# Patient Record
Sex: Male | Born: 1975 | Race: White | Hispanic: Yes | Marital: Married | Smoking: Never smoker
Health system: Southern US, Community
[De-identification: ages and names within clinical notes are randomized; demographics above are authoritative.]

## PROBLEM LIST (undated history)

## (undated) HISTORY — PX: LUMBAR FUSION: SHX111

## (undated) HISTORY — PX: TONSILLECTOMY: SUR1361

## (undated) HISTORY — PX: BACK SURGERY: SHX140

---

## 2003-12-17 ENCOUNTER — Inpatient Hospital Stay (HOSPITAL_COMMUNITY): Admission: RE | Admit: 2003-12-17 | Discharge: 2003-12-19 | Payer: Self-pay | Admitting: Neurosurgery

## 2004-02-03 ENCOUNTER — Encounter: Admission: RE | Admit: 2004-02-03 | Discharge: 2004-02-03 | Payer: Self-pay | Admitting: Neurosurgery

## 2004-03-30 ENCOUNTER — Encounter: Admission: RE | Admit: 2004-03-30 | Discharge: 2004-03-30 | Payer: Self-pay | Admitting: Neurosurgery

## 2005-08-03 ENCOUNTER — Ambulatory Visit (HOSPITAL_COMMUNITY): Admission: RE | Admit: 2005-08-03 | Discharge: 2005-08-03 | Payer: Self-pay | Admitting: Neurosurgery

## 2008-10-20 ENCOUNTER — Emergency Department: Payer: Self-pay | Admitting: Emergency Medicine

## 2012-09-17 DIAGNOSIS — E669 Obesity, unspecified: Secondary | ICD-10-CM | POA: Insufficient documentation

## 2017-04-05 ENCOUNTER — Ambulatory Visit
Admission: EM | Admit: 2017-04-05 | Discharge: 2017-04-05 | Disposition: A | Discharge status: Home / Self Care | Payer: Self-pay | Attending: Family Medicine | Admitting: Family Medicine

## 2017-04-05 ENCOUNTER — Ambulatory Visit (INDEPENDENT_AMBULATORY_CARE_PROVIDER_SITE_OTHER): Payer: Self-pay

## 2017-04-05 DIAGNOSIS — W19XXXA Unspecified fall, initial encounter: Secondary | ICD-10-CM

## 2017-04-05 DIAGNOSIS — M62830 Muscle spasm of back: Secondary | ICD-10-CM

## 2017-04-05 DIAGNOSIS — S42402A Unspecified fracture of lower end of left humerus, initial encounter for closed fracture: Secondary | ICD-10-CM

## 2017-04-05 MED ORDER — KETOROLAC TROMETHAMINE 60 MG/2ML IM SOLN
60.0000 mg | Freq: Once | INTRAMUSCULAR | Status: AC
  Administered 2017-04-05: 60 mg via INTRAMUSCULAR

## 2017-04-05 MED ORDER — ORPHENADRINE CITRATE ER 100 MG PO TB12: 100.0000 mg | ORAL_TABLET | Freq: Two times a day (BID) | ORAL | 0 refills | Status: DC

## 2017-04-05 MED ORDER — HYDROCODONE-ACETAMINOPHEN 5-325 MG PO TABS: 1.0000 | ORAL_TABLET | Freq: Three times a day (TID) | ORAL | 0 refills | Status: DC | PRN

## 2017-04-05 MED ORDER — MELOXICAM 15 MG PO TABS: 15.0000 mg | ORAL_TABLET | Freq: Every day | ORAL | 0 refills | Status: DC

## 2017-04-05 NOTE — ED Triage Notes (Signed)
Patient complains of a fall down the steps around 12pm. Patient states that he was coming down the steps and fell backwards and hit his lower back (bruising and redness in area) and his left elbow. Patient states that pain has been worsening throughout the day.

## 2017-04-05 NOTE — ED Notes (Signed)
Applied posterior splint to left arm. Tolerated well. Patient instructed on follow up and care of splint.

## 2017-04-05 NOTE — ED Provider Notes (Signed)
[facility]    CSN: [id number] Arrival date & time: 04/05/17  1634     History   Chief Complaint Chief Complaint  Patient presents with  . Fall    HPI Nicolas Pearson is a 41 y.o. male.   Patient is a 41 year old Hispanic male who states he slipped on some paper that was obliterating of some steps due to construction. States he fell back with his left arm out hurting his left elbow but also landing on his left hip and pelvic area. He's had a history of back surgery before but does not lumbar spines perfused but he is no swelling and increased pain in the left sacroiliac area as well. We revisited the pains about 6-7/10 standing still the second 9-10 over 10. Pain is progressively gotten worse during the day. His has some pain in his left elbow as well and some discomfort moving left elbow. He denies hitting his head or any loss of consciousness. He did not seem drug allergies he does not smoke. Size the lumbar fusion that he had years ago he's also had tonsillectomy. No pertinent family medical history relevant to today's visit.   The history is provided by the patient. No language interpreter was used.  Fall  This is a new problem. The current episode started 6 to 12 hours ago. The problem occurs constantly. The problem has not changed since onset.Pertinent negatives include no chest pain, no abdominal pain, no headaches and no shortness of breath. The symptoms are aggravated by standing. Nothing relieves the symptoms. He has tried nothing for the symptoms. The treatment provided no relief.    History reviewed. No pertinent past medical history.  There are no active problems to display for this patient.   Past Surgical History:  Procedure Laterality Date  . LUMBAR FUSION    . TONSILLECTOMY         Home Medications    Prior to Admission medications   Medication Sig Start Date End Date Taking? Authorizing Provider  HYDROcodone-acetaminophen (NORCO/VICODIN) 5-325 MG  tablet Take 1 tablet by mouth 3 (three) times daily as needed for moderate pain or severe pain. 04/05/17   Hassan RowanWade, Heraclio Seidman, MD  meloxicam (MOBIC) 15 MG tablet Take 1 tablet (15 mg total) by mouth daily. 04/05/17   Hassan RowanWade, Jasten Guyette, MD  orphenadrine (NORFLEX) 100 MG tablet Take 1 tablet (100 mg total) by mouth 2 (two) times daily. 04/05/17   Hassan RowanWade, Antonyo Hinderer, MD    Family History History reviewed. No pertinent family history.  Social History Social History  Substance Use Topics  . Smoking status: Never Smoker  . Smokeless tobacco: Never Used  . Alcohol use Yes     Comment: occasionally     Allergies   Patient has no known allergies.   Review of Systems Review of Systems  Respiratory: Negative for shortness of breath.   Cardiovascular: Negative for chest pain.  Gastrointestinal: Negative for abdominal pain.  Musculoskeletal: Positive for arthralgias, back pain and joint swelling.  Skin: Positive for color change.  Neurological: Negative for headaches.     Physical Exam Triage Vital Signs ED Triage Vitals  Enc Vitals Group     BP 04/05/17 1804 (!) 134/96     Pulse Rate 04/05/17 1804 70     Resp 04/05/17 1804 16     Temp 04/05/17 1804 98.2 F (36.8 C)     Temp Source 04/05/17 1804 Oral     SpO2 04/05/17 1804 100 %  Weight 04/05/17 1802 190 lb (86.2 kg)     Height 04/05/17 1802  (1.702 m)     Head Circumference --      Peak Flow --      Pain Score 04/05/17 1802 9     Pain Loc --      Pain Edu? --      Excl. in GC? --    No data found.   Updated Vital Signs BP (!) 134/96 (BP Location: Left Arm)   Pulse 70   Temp 98.2 F (36.8 C) (Oral)   Resp 16   Ht  (1.702 m)   Wt 190 lb (86.2 kg)   SpO2 100%   BMI 29.76 kg/m   Visual Acuity Right Eye Distance:   Left Eye Distance:   Bilateral Distance:    Right Eye Near:   Left Eye Near:    Bilateral Near:     Physical Exam  Constitutional: He is oriented to person, place, and time. He appears well-developed and  well-nourished.  HENT:  Head: Normocephalic and atraumatic.  Right Ear: External ear normal.  Left Ear: External ear normal.  Mouth/Throat: Oropharynx is clear and moist.  Eyes: Pupils are equal, round, and reactive to light. EOM are normal.  Neck: Normal range of motion. Neck supple.  Pulmonary/Chest: Effort normal.  Musculoskeletal: He exhibits tenderness. He exhibits no deformity.       Lumbar back: He exhibits tenderness, bony tenderness, swelling, edema and spasm.       Back:  He is bruising over the left iliac sacral area. This old surgical scar along the lumbar spine is tenderness over the left elbow as well.  Neurological: He is alert and oriented to person, place, and time. No cranial nerve deficit.  Psychiatric: He has a normal mood and affect.  Vitals reviewed.    UC Treatments / Results  Labs (all labs ordered are listed, but only abnormal results are displayed) Labs Reviewed - No data to display  EKG  EKG Interpretation None       Radiology Dg Lumbar Spine Complete  Result Date: 04/05/2017 CLINICAL DATA:  Fall with low back pain EXAM: LUMBAR SPINE - COMPLETE 4+ VIEW COMPARISON:  MRI 08/03/2005, 03/30/2004 radiograph, 02/03/2004 FINDINGS: Alignment within normal limits. Posterior stabilization rods and fixating screws at L5 and S1 with intact hardware. Marked narrowing at L5-S1. Mild narrowing at L4-L5. Vertebral body heights are normal IMPRESSION: Postsurgical changes at L5-S1.  No acute osseous abnormality. Electronically Signed   By: Jasmine Pang M.D.   On: 04/05/2017 19:17   Dg Si Joints  Result Date: 04/05/2017 CLINICAL DATA:  Larey Seat down the steps hit lower back with worsening pain EXAM: BILATERAL SACROILIAC JOINTS - 3+ VIEW COMPARISON:  03/30/2004 FINDINGS: The sacroiliac joint spaces are maintained and there is no evidence of arthropathy. No other bone abnormalities are seen. IMPRESSION: Negative. Electronically Signed   By: Jasmine Pang M.D.   On: 04/05/2017  19:14   Dg Elbow Complete Left  Result Date: 04/05/2017 CLINICAL DATA:  Fall down stairs with injury to the left elbow, worsening pain. EXAM: LEFT ELBOW - COMPLETE 3+ VIEW COMPARISON:  None FINDINGS: Suspicion for a small posterior radial head fracture. Supinator fat pad unremarkable. No definite elbow effusion. Mild soft tissue swelling posterior to the olecranon. Mild spurring of the coronoid process. IMPRESSION: 1. Suspicion for a subtle nondisplaced posterior radial head fracture. 2. Mild soft tissue swelling overlying the olecranon. Electronically Signed   By: Zollie Beckers  Ova Freshwater M.D.   On: 04/05/2017 19:13    Procedures Procedures (including critical care time)  Medications Ordered in UC Medications  ketorolac (TORADOL) injection 60 mg (60 mg Intramuscular Given 04/05/17 1904)     Initial Impression / Assessment and Plan / UC Course  I have reviewed the triage vital signs and the nursing notes.  Pertinent labs & imaging results that were available during my care of the patient were reviewed by me and considered in my medical decision making (see chart for details).   patient has a fracture of his left elbow no fracture seen in the iliac 6 area or the lower back will place him on Norco one tablet every 8 hours when necessary  mobile 15 mg  and Norflex one tablet twice a day as needed  Final Clinical Impressions(s) / UC Diagnoses   Final diagnoses:  Fall, initial encounter  Muscle spasm of back  Elbow fracture, left, closed, initial encounter    New Prescriptions New Prescriptions   HYDROCODONE-ACETAMINOPHEN (NORCO/VICODIN) 5-325 MG TABLET    Take 1 tablet by mouth 3 (three) times daily as needed for moderate pain or severe pain.   MELOXICAM (MOBIC) 15 MG TABLET    Take 1 tablet (15 mg total) by mouth daily.   ORPHENADRINE (NORFLEX) 100 MG TABLET    Take 1 tablet (100 mg total) by mouth 2 (two) times daily.   Note: This dictation was prepared with Dragon dictation along with  smaller phrase technology. Any transcriptional errors that result from this process are unintentional.  Controlled Substance Prescriptions [place] Controlled Substance Registry consulted? Not Applicable   Hassan Rowan, MD 04/05/17 2017

## 2017-08-14 ENCOUNTER — Other Ambulatory Visit: Payer: Self-pay

## 2017-08-14 ENCOUNTER — Ambulatory Visit
Admission: EM | Admit: 2017-08-14 | Discharge: 2017-08-14 | Disposition: A | Discharge status: Home / Self Care | Payer: Self-pay | Attending: Family Medicine | Admitting: Family Medicine

## 2017-08-14 ENCOUNTER — Encounter: Payer: Self-pay | Admitting: Emergency Medicine

## 2017-08-14 DIAGNOSIS — B349 Viral infection, unspecified: Secondary | ICD-10-CM

## 2017-08-14 DIAGNOSIS — R509 Fever, unspecified: Secondary | ICD-10-CM

## 2017-08-14 DIAGNOSIS — R1013 Epigastric pain: Secondary | ICD-10-CM

## 2017-08-14 DIAGNOSIS — R197 Diarrhea, unspecified: Secondary | ICD-10-CM

## 2017-08-14 LAB — CBC WITH DIFFERENTIAL/PLATELET
Basophils Absolute: 0 10*3/uL (ref 0–0.1)
Basophils Relative: 1 %
Eosinophils Absolute: 0.2 10*3/uL (ref 0–0.7)
Eosinophils Relative: 2 %
HEMATOCRIT: 43.2 % (ref 40.0–52.0)
HEMOGLOBIN: 15.1 g/dL (ref 13.0–18.0)
LYMPHS ABS: 2.4 10*3/uL (ref 1.0–3.6)
LYMPHS PCT: 24 %
MCH: 29.6 pg (ref 26.0–34.0)
MCHC: 35 g/dL (ref 32.0–36.0)
MCV: 84.6 fL (ref 80.0–100.0)
MONOS PCT: 8 %
Monocytes Absolute: 0.9 10*3/uL (ref 0.2–1.0)
NEUTROS ABS: 6.8 10*3/uL — AB (ref 1.4–6.5)
NEUTROS PCT: 65 %
Platelets: 178 10*3/uL (ref 150–440)
RBC: 5.11 MIL/uL (ref 4.40–5.90)
RDW: 13.2 % (ref 11.5–14.5)
WBC: 10.3 10*3/uL (ref 3.8–10.6)

## 2017-08-14 LAB — COMPREHENSIVE METABOLIC PANEL
ALBUMIN: 4.1 g/dL (ref 3.5–5.0)
ALT: 51 U/L (ref 17–63)
ANION GAP: 8 (ref 5–15)
AST: 26 U/L (ref 15–41)
Alkaline Phosphatase: 40 U/L (ref 38–126)
BUN: 15 mg/dL (ref 6–20)
CHLORIDE: 102 mmol/L (ref 101–111)
CO2: 27 mmol/L (ref 22–32)
Calcium: 8.9 mg/dL (ref 8.9–10.3)
Creatinine, Ser: 0.87 mg/dL (ref 0.61–1.24)
GFR calc non Af Amer: >60 mL/min (ref >60)
Glucose, Bld: 119 mg/dL — ABNORMAL HIGH (ref 65–99)
Potassium: 3.8 mmol/L (ref 3.5–5.1)
SODIUM: 137 mmol/L (ref 135–145)
Total Bilirubin: 0.8 mg/dL (ref 0.3–1.2)
Total Protein: 8 g/dL (ref 6.5–8.1)

## 2017-08-14 LAB — LIPASE, BLOOD: Lipase: 38 U/L (ref 11–51)

## 2017-08-14 LAB — RAPID INFLUENZA A&B ANTIGENS (ARMC ONLY): INFLUENZA B (ARMC): NEGATIVE

## 2017-08-14 LAB — RAPID INFLUENZA A&B ANTIGENS: Influenza A (ARMC): NEGATIVE

## 2017-08-14 MED ORDER — TRAMADOL HCL 50 MG PO TABS: 50.0000 mg | ORAL_TABLET | Freq: Three times a day (TID) | ORAL | 0 refills | Status: DC | PRN

## 2017-08-14 NOTE — ED Triage Notes (Signed)
Patient in today c/o fever since Saturday (103.5), body aches, diarrhea, abdominal pain (RLQ). Patient has tried OTC Nyquil.

## 2017-08-14 NOTE — ED Provider Notes (Signed)
[facility]    CSN: [id number] Arrival date & time: 08/14/17  1051  History   Chief Complaint Chief Complaint  Patient presents with  . Fever   HPI  42 year old male presents with fever, body aches, recent diarrhea, and reports of abdominal pain.  Started on Saturday.  Started initially with fever.  He subsequently developed body aches and diarrhea.  Diarrhea has now resolved.  He still has some "abdominal cramping".  He endorses pain in the epigastric region and right lower quadrant.  He is taken NyQuil for his fever with improvement.  He is afebrile currently.  No known inciting factor.  No known exacerbating factors.  No other associated symptoms.  No other complaints or concerns at this time.  Social History Social History   Tobacco Use  . Smoking status: Never Smoker  . Smokeless tobacco: Never Used  Substance Use Topics  . Alcohol use: Yes    Comment: occasionally  . Drug use: No   Allergies   Patient has no known allergies.  Review of Systems Review of Systems  Constitutional: Positive for fever.  Gastrointestinal: Positive for abdominal pain and diarrhea.   Physical Exam Triage Vital Signs ED Triage Vitals  Enc Vitals Group     BP 08/14/17 1132 (!) 141/94     Pulse Rate 08/14/17 1132 82     Resp 08/14/17 1132 16     Temp 08/14/17 1132 98 F (36.7 C)     Temp Source 08/14/17 1132 Oral     SpO2 08/14/17 1132 100 %     Weight 08/14/17 1131 200 lb (90.7 kg)     Height 08/14/17 1131 5\' 7"  (1.702 m)     Head Circumference --      Peak Flow --      Pain Score 08/14/17 1132 4     Pain Loc --      Pain Edu? --      Excl. in GC? --    Updated Vital Signs BP (!) 141/94 (BP Location: Left Arm)   Pulse 82   Temp 98 F (36.7 C) (Oral)   Resp 16   Ht 5\' 7"  (1.702 m)   Wt 200 lb (90.7 kg)   SpO2 100%   BMI 31.32 kg/m     Physical Exam  Constitutional: He is oriented to person, place, and time. He appears well-developed and well-nourished. No  distress.  HENT:  Head: Normocephalic and atraumatic.  Mouth/Throat: Oropharynx is clear and moist.  Eyes: Conjunctivae are normal.  Cardiovascular: Normal rate and regular rhythm.  Pulmonary/Chest: Effort normal and breath sounds normal. He has no wheezes. He has no rales.  Abdominal: There is no rebound and no guarding.  Soft, nondistended.  Tender to palpation in the epigastric region and mildly tender to palpation in the right lower quadrant.  Neurological: He is alert and oriented to person, place, and time.  Psychiatric: He has a normal mood and affect. His behavior is normal.  Nursing note and vitals reviewed.  UC Treatments / Results  Labs (all labs ordered are listed, but only abnormal results are displayed) Labs Reviewed  COMPREHENSIVE METABOLIC PANEL - Abnormal; Notable for the following components:      Result Value   Glucose, Bld 119 (*)    All other components within normal limits  CBC WITH DIFFERENTIAL/PLATELET - Abnormal; Notable for the following components:   Neutro Abs 6.8 (*)    All other components within normal limits  RAPID INFLUENZA A&B  ANTIGENS ([facility] ONLY)  LIPASE, BLOOD    EKG  EKG Interpretation None       Radiology No results found.  Procedures Procedures (including critical care time)  Medications Ordered in UC Medications - No data to display   Initial Impression / Assessment and Plan / UC Course  I have reviewed the triage vital signs and the nursing notes.  Pertinent labs & imaging results that were available during my care of the patient were reviewed by me and considered in my medical decision making (see chart for details).     42 year old male presents with fever, diarrhea, abdominal pain.  Concern for influenza.  Influenza testing was negative.  CBC without leukocytosis.  He did have a slight left shift.  Metabolic panel unremarkable.  Appears well at this time.  Suspect viral etiology.  Treating pain with tramadol.  Advised to  go to the hospital if he fails to improve or worsens.  Final Clinical Impressions(s) / UC Diagnoses   Final diagnoses:  Viral illness    ED Discharge Orders        Ordered    traMADol (ULTRAM) 50 MG tablet  Every 8 hours PRN     08/14/17 1243     Controlled Substance Prescriptions [place] Controlled Substance Registry consulted? Not Applicable   Tommie SamsCook, Belinda Schlichting G, DO 08/14/17 1540

## 2017-08-14 NOTE — Discharge Instructions (Signed)
Flu negative.  Labs were unremarkable.  This is likely viral.  Use the tramadol as directed.  Rest, fluids.  If you worsen, go to the ER.  Take care  Dr. Adriana Simasook

## 2017-08-17 ENCOUNTER — Telehealth: Payer: Self-pay

## 2017-08-17 NOTE — Telephone Encounter (Signed)
Called to follow up with patient since visit here at [facility]. Patient instructed to call back with any questions or concerns. MAH  

## 2018-05-03 ENCOUNTER — Ambulatory Visit
Admission: EM | Admit: 2018-05-03 | Discharge: 2018-05-03 | Disposition: A | Discharge status: Home / Self Care | Payer: Self-pay | Attending: Emergency Medicine | Admitting: Emergency Medicine

## 2018-05-03 ENCOUNTER — Encounter: Payer: Self-pay | Admitting: Emergency Medicine

## 2018-05-03 ENCOUNTER — Other Ambulatory Visit: Payer: Self-pay

## 2018-05-03 DIAGNOSIS — R05 Cough: Secondary | ICD-10-CM

## 2018-05-03 DIAGNOSIS — J069 Acute upper respiratory infection, unspecified: Secondary | ICD-10-CM

## 2018-05-03 DIAGNOSIS — J029 Acute pharyngitis, unspecified: Secondary | ICD-10-CM

## 2018-05-03 LAB — RAPID STREP SCREEN (MED CTR MEBANE ONLY): Streptococcus, Group A Screen (Direct): NEGATIVE

## 2018-05-03 MED ORDER — HYDROCOD POLST-CPM POLST ER 10-8 MG/5ML PO SUER: 5.0000 mL | Freq: Two times a day (BID) | ORAL | 0 refills | Status: DC

## 2018-05-03 MED ORDER — FLUTICASONE PROPIONATE 50 MCG/ACT NA SUSP: 2.0000 | Freq: Every day | NASAL | 0 refills | Status: DC

## 2018-05-03 MED ORDER — BENZONATATE 200 MG PO CAPS: ORAL_CAPSULE | ORAL | 0 refills | Status: DC

## 2018-05-03 NOTE — ED Provider Notes (Signed)
[facility]    CSN: [id number] Arrival date & time: 05/03/18  0808     History   Chief Complaint Chief Complaint  Patient presents with  . Sinus Problem    HPI Nicolas Pearson is a 42 y.o. male.   HPI  42 year old male presents with a sinus congestion and pressure, cough, chest congestion and sore throat that started on Tuesday 3 days prior to this visit.  He has not had chills or measurable fever.  He is afebrile today.  O2 sats are 98%.  He states that the coughing is worse at nighttime.  It has not been very productive so far.  He has had sinus drainage.       History reviewed. No pertinent past medical history.  There are no active problems to display for this patient.   Past Surgical History:  Procedure Laterality Date  . BACK SURGERY    . LUMBAR FUSION    . TONSILLECTOMY         Home Medications    Prior to Admission medications   Medication Sig Start Date End Date Taking? Authorizing Provider  benzonatate (TESSALON) 200 MG capsule Take one cap TID PRN cough 05/03/18   Lutricia Feil, PA-C  chlorpheniramine-HYDROcodone [facility]) 10-8 MG/5ML SUER Take 5 mLs by mouth 2 (two) times daily. 05/03/18   Lutricia Feil, PA-C  fluticasone (FLONASE) 50 MCG/ACT nasal spray Place 2 sprays into both nostrils daily. 05/03/18   Lutricia Feil, PA-C    Family History Family History  Problem Relation Age of Onset  . Hypertension Mother   . Healthy Father     Social History Social History   Tobacco Use  . Smoking status: Never Smoker  . Smokeless tobacco: Never Used  Substance Use Topics  . Alcohol use: Yes    Comment: occasionally  . Drug use: No     Allergies   Patient has no known allergies.   Review of Systems Review of Systems  Constitutional: Positive for activity change. Negative for appetite change, chills, fatigue and fever.  HENT: Positive for congestion, postnasal drip, sinus pressure, sinus pain and  sore throat.   Respiratory: Positive for cough. Negative for shortness of breath and stridor.   All other systems reviewed and are negative.    Physical Exam Triage Vital Signs ED Triage Vitals  Enc Vitals Group     BP 05/03/18 0817 (!) 150/97     Pulse Rate 05/03/18 0817 70     Resp 05/03/18 0817 16     Temp 05/03/18 0817 98.6 F (37 C)     Temp Source 05/03/18 0817 Oral     SpO2 05/03/18 0817 98 %     Weight 05/03/18 0816 190 lb (86.2 kg)     Height 05/03/18 0816 5\' 7"  (1.702 m)     Head Circumference --      Peak Flow --      Pain Score 05/03/18 0816 4     Pain Loc --      Pain Edu? --      Excl. in GC? --    No data found.  Updated Vital Signs BP (!) 150/97 (BP Location: Left Arm)   Pulse 70   Temp 98.6 F (37 C) (Oral)   Resp 16   Ht 5\' 7"  (1.702 m)   Wt 190 lb (86.2 kg)   SpO2 98%   BMI 29.76 kg/m   Visual Acuity Right Eye Distance:  Left Eye Distance:   Bilateral Distance:    Right Eye Near:   Left Eye Near:    Bilateral Near:     Physical Exam  Constitutional: He is oriented to person, place, and time. He appears well-developed and well-nourished. No distress.  HENT:  Head: Normocephalic.  Left Ear: External ear normal.  Nose: Nose normal.  Mouth/Throat: Oropharynx is clear and moist. No oropharyngeal exudate.  Right TM is very sclerotic at the 10 to 12 o'clock position  Eyes: Pupils are equal, round, and reactive to light. Right eye exhibits no discharge. Left eye exhibits no discharge.  Neck: Normal range of motion.  Pulmonary/Chest: Effort normal and breath sounds normal. No stridor. He has no wheezes. He has no rales.  Musculoskeletal: Normal range of motion.  Neurological: He is alert and oriented to person, place, and time.  Skin: Skin is warm and dry. He is not diaphoretic.  Psychiatric: He has a normal mood and affect. His behavior is normal. Judgment and thought content normal.  Nursing note and vitals reviewed.    UC Treatments /  Results  Labs (all labs ordered are listed, but only abnormal results are displayed) Labs Reviewed  RAPID STREP SCREEN ([facility] ONLY)  CULTURE, GROUP A STREP [facility])    EKG None  Radiology No results found.  Procedures Procedures (including critical care time)  Medications Ordered in UC Medications - No data to display  Initial Impression / Assessment and Plan / UC Course  I have reviewed the triage vital signs and the nursing notes.  Pertinent labs & imaging results that were available during my care of the patient were reviewed by me and considered in my medical decision making (see chart for details). I have told the patient has an upper respiratory infection that is likely viral does not require antibiotics at this time.  Recommended and prescribed Flonase nasal spray on a daily basis for 2 to 3 weeks Tessalon Perles for cough during the daytime and hydrocodone cough syrup for nighttime use to allow rest.  If he is not improving or is worsening he may return to our clinic or follow-up with a primary care physician     Final Clinical Impressions(s) / UC Diagnoses   Final diagnoses:  Acute upper respiratory infection   Discharge Instructions   None    ED Prescriptions    Medication Sig Dispense Auth. Provider   fluticasone (FLONASE) 50 MCG/ACT nasal spray Place 2 sprays into both nostrils daily. 16 g Ovid Curd P, PA-C   benzonatate (TESSALON) 200 MG capsule Take one cap TID PRN cough 30 capsule Lutricia Feil, PA-C   chlorpheniramine-HYDROcodone (TUSSIONEX PENNKINETIC ER) 10-8 MG/5ML SUER Take 5 mLs by mouth 2 (two) times daily. 115 mL Lutricia Feil, PA-C     Controlled Substance Prescriptions [place] Controlled Substance Registry consulted? Not Applicable   Lutricia Feil, PA-C 05/03/18 1329

## 2018-05-03 NOTE — ED Triage Notes (Signed)
Patient c/o sinus congestion and pressure, cough, chest congestion and sore throat that started on Tuesday.

## 2018-05-05 LAB — CULTURE, GROUP A STREP (THRC)

## 2018-06-29 ENCOUNTER — Other Ambulatory Visit: Payer: Self-pay

## 2018-06-29 ENCOUNTER — Ambulatory Visit
Admission: EM | Admit: 2018-06-29 | Discharge: 2018-06-29 | Disposition: A | Discharge status: Home / Self Care | Payer: Self-pay | Attending: Family Medicine | Admitting: Family Medicine

## 2018-06-29 DIAGNOSIS — J111 Influenza due to unidentified influenza virus with other respiratory manifestations: Secondary | ICD-10-CM

## 2018-06-29 LAB — RAPID INFLUENZA A&B ANTIGENS (ARMC ONLY)
INFLUENZA A (ARMC): POSITIVE — AB
INFLUENZA B (ARMC): NEGATIVE

## 2018-06-29 LAB — RAPID STREP SCREEN (MED CTR MEBANE ONLY): Streptococcus, Group A Screen (Direct): NEGATIVE

## 2018-06-29 MED ORDER — LIDOCAINE VISCOUS HCL 2 % MT SOLN: OROMUCOSAL | 0 refills | Status: DC

## 2018-06-29 MED ORDER — OSELTAMIVIR PHOSPHATE 75 MG PO CAPS: 75.0000 mg | ORAL_CAPSULE | Freq: Two times a day (BID) | ORAL | 0 refills | Status: DC

## 2018-06-29 NOTE — ED Triage Notes (Signed)
Pt with sore throat and dizziness since yesterday. Left sided headache. Pain 8/10

## 2018-06-29 NOTE — ED Provider Notes (Signed)
[facility]    CSN: [id number] Arrival date & time: 06/29/18  1117  History   Chief Complaint Chief Complaint  Patient presents with  . Sore Throat   HPI  42 year old male presents with sore throat, headache, dizziness, fever.  Symptoms started abruptly yesterday.  Patient feels very poorly.  Pain is 8/10 in severity.  Severe sore throat.  Associated headache, dizziness, and fever.  He is febrile today.  No medications or interventions tried.  No known exacerbating factors.  No other associated symptoms.  No other complaints.  Hx reviewed as below. History reviewed. No pertinent past medical history.  Past Surgical History:  Procedure Laterality Date  . BACK SURGERY    . LUMBAR FUSION    . TONSILLECTOMY     Home Medications    Prior to Admission medications   Medication Sig Start Date End Date Taking? Authorizing Provider  lidocaine (XYLOCAINE) 2 % solution Gargle 15 mL every 3 hours as needed. May swallow if desired. 06/29/18   Tommie Samsook, Jaciel Diem G, DO  oseltamivir (TAMIFLU) 75 MG capsule Take 1 capsule (75 mg total) by mouth every 12 (twelve) hours. 06/29/18   Tommie Samsook, Laurenashley Viar G, DO    Family History Family History  Problem Relation Age of Onset  . Hypertension Mother   . Healthy Father     Social History Social History   Tobacco Use  . Smoking status: Never Smoker  . Smokeless tobacco: Never Used  Substance Use Topics  . Alcohol use: Yes    Comment: occasionally  . Drug use: No     Allergies   Patient has no known allergies.   Review of Systems Review of Systems  Constitutional: Positive for fever.  HENT: Positive for sore throat.   Neurological: Positive for dizziness and headaches.   Physical Exam Triage Vital Signs ED Triage Vitals  Enc Vitals Group     BP 06/29/18 1142 (!) 142/68     Pulse Rate 06/29/18 1142 (!) 107     Resp 06/29/18 1142 18     Temp 06/29/18 1142 100.3 F (37.9 C)     Temp Source 06/29/18 1142 Oral     SpO2 06/29/18  1142 97 %     Weight 06/29/18 1143 195 lb (88.5 kg)     Height 06/29/18 1143 5\' 7"  (1.702 m)     Head Circumference --      Peak Flow --      Pain Score 06/29/18 1143 8     Pain Loc --      Pain Edu? --      Excl. in GC? --    Updated Vital Signs BP (!) 142/68 (BP Location: Left Arm)   Pulse (!) 107   Temp 100.3 F (37.9 C) (Oral)   Resp 18   Ht 5\' 7"  (1.702 m)   Wt 88.5 kg   SpO2 97%   BMI 30.54 kg/m   Visual Acuity Right Eye Distance:   Left Eye Distance:   Bilateral Distance:    Right Eye Near:   Left Eye Near:    Bilateral Near:     Physical Exam  Constitutional: He is oriented to person, place, and time. He appears well-developed. No distress.  HENT:  Head: Normocephalic and atraumatic.  Oropharynx with severe erythema.  Neck: Neck supple.  Cardiovascular:  Tachycardic.  Regular rhythm.  Pulmonary/Chest: Effort normal and breath sounds normal. He has no wheezes. He has no rales.  Lymphadenopathy:    He has  cervical adenopathy.  Neurological: He is alert and oriented to person, place, and time.  Psychiatric: He has a normal mood and affect. His behavior is normal.  Nursing note and vitals reviewed.  UC Treatments / Results  Labs (all labs ordered are listed, but only abnormal results are displayed) Labs Reviewed  RAPID INFLUENZA A&B ANTIGENS ([facility] ONLY) - Abnormal; Notable for the following components:      Result Value   Influenza A ([facility]) POSITIVE (*)    All other components within normal limits  RAPID STREP SCREEN ([facility] ONLY)  CULTURE, GROUP A STREP [facility])    EKG None  Radiology No results found.  Procedures Procedures (including critical care time)  Medications Ordered in UC Medications - No data to display  Initial Impression / Assessment and Plan / UC Course  I have reviewed the triage vital signs and the nursing notes.  Pertinent labs & imaging results that were available during my care of the patient were reviewed by me  and considered in my medical decision making (see chart for details).    42 year old male presents with influenza.  Patient with Tamiflu.  Viscous lidocaine for severe sore throat.  Supportive care.  Final Clinical Impressions(s) / UC Diagnoses   Final diagnoses:  Influenza     Discharge Instructions     Medications as prescribed.  Take care  Dr. Adriana Simas    ED Prescriptions    Medication Sig Dispense Auth. Provider   lidocaine (XYLOCAINE) 2 % solution Gargle 15 mL every 3 hours as needed. May swallow if desired. 200 mL Kylene Zamarron G, DO   oseltamivir (TAMIFLU) 75 MG capsule Take 1 capsule (75 mg total) by mouth every 12 (twelve) hours. 10 capsule Tommie Sams, DO     Controlled Substance Prescriptions [place] Controlled Substance Registry consulted? Not Applicable   Tommie Sams, DO 06/29/18 1338

## 2018-06-29 NOTE — Discharge Instructions (Signed)
Medications as prescribed. ° °Take care ° °Dr. Sukanya Goldblatt  °

## 2018-07-01 LAB — CULTURE, GROUP A STREP (THRC)

## 2018-07-02 ENCOUNTER — Telehealth: Payer: Self-pay | Admitting: Family Medicine

## 2018-07-02 MED ORDER — AMOXICILLIN 500 MG PO TABS: 500.0000 mg | ORAL_TABLET | Freq: Two times a day (BID) | ORAL | 0 refills | Status: DC

## 2018-07-02 NOTE — Telephone Encounter (Signed)
+   Strep.  Starting on Amoxicillin.

## 2018-07-04 ENCOUNTER — Telehealth (HOSPITAL_COMMUNITY): Payer: Self-pay | Admitting: Emergency Medicine

## 2018-07-04 NOTE — Telephone Encounter (Signed)
Attempted to reach patient x3. No answer at this time. Voicemail left. Letter sent    

## 2018-11-04 ENCOUNTER — Ambulatory Visit
Admission: EM | Admit: 2018-11-04 | Discharge: 2018-11-04 | Disposition: A | Discharge status: Home / Self Care | Payer: Self-pay | Attending: Family Medicine | Admitting: Family Medicine

## 2018-11-04 ENCOUNTER — Ambulatory Visit (INDEPENDENT_AMBULATORY_CARE_PROVIDER_SITE_OTHER): Payer: Self-pay

## 2018-11-04 ENCOUNTER — Other Ambulatory Visit: Payer: Self-pay

## 2018-11-04 ENCOUNTER — Encounter: Payer: Self-pay | Admitting: Emergency Medicine

## 2018-11-04 DIAGNOSIS — S92811A Other fracture of right foot, initial encounter for closed fracture: Secondary | ICD-10-CM

## 2018-11-04 DIAGNOSIS — M79671 Pain in right foot: Secondary | ICD-10-CM

## 2018-11-04 DIAGNOSIS — Y999 Unspecified external cause status: Secondary | ICD-10-CM

## 2018-11-04 MED ORDER — HYDROCODONE-ACETAMINOPHEN 5-325 MG PO TABS: ORAL_TABLET | ORAL | 0 refills | Status: DC

## 2018-11-04 MED ORDER — MELOXICAM 15 MG PO TABS: 15.0000 mg | ORAL_TABLET | Freq: Every day | ORAL | 0 refills | Status: DC

## 2018-11-04 NOTE — ED Triage Notes (Signed)
Patient c/o pain in right foot that started on Saturday. He denies injury. Painful to walk on since Saturday.

## 2018-11-04 NOTE — Discharge Instructions (Signed)
Rest, ice, elevate, boot for immobilization Follow up with podiatrist

## 2018-11-04 NOTE — ED Provider Notes (Signed)
[facility]    CSN: [id number] Arrival date & time: 11/04/18  2979     History   Chief Complaint Chief Complaint  Patient presents with   Foot Pain    HPI Nicolas Pearson is a 43 y.o. male.   43 yo male with a c/o pain to the outside of his right foot for the past 3 days. Denies any injuries or falls, redness, swelling or rash.   The history is provided by the patient.  Foot Pain     History reviewed. No pertinent past medical history.  There are no active problems to display for this patient.   Past Surgical History:  Procedure Laterality Date   BACK SURGERY     LUMBAR FUSION     TONSILLECTOMY         Home Medications    Prior to Admission medications   Medication Sig Start Date End Date Taking? Authorizing Provider  amoxicillin (AMOXIL) 500 MG tablet Take 1 tablet (500 mg total) by mouth 2 (two) times daily. 07/02/18   Tommie Sams, DO  HYDROcodone-acetaminophen (NORCO/VICODIN) 5-325 MG tablet 1-2 tabs po qd prn 11/04/18   Payton Mccallum, MD  lidocaine (XYLOCAINE) 2 % solution Gargle 15 mL every 3 hours as needed. May swallow if desired. 06/29/18   Tommie Sams, DO  meloxicam (MOBIC) 15 MG tablet Take 1 tablet (15 mg total) by mouth daily. 11/04/18   Payton Mccallum, MD  oseltamivir (TAMIFLU) 75 MG capsule Take 1 capsule (75 mg total) by mouth every 12 (twelve) hours. 06/29/18   Tommie Sams, DO    Family History Family History  Problem Relation Age of Onset   Hypertension Mother    Healthy Father     Social History Social History   Tobacco Use   Smoking status: Never Smoker   Smokeless tobacco: Never Used  Substance Use Topics   Alcohol use: Yes    Comment: occasionally   Drug use: No     Allergies   Patient has no known allergies.   Review of Systems Review of Systems   Physical Exam Triage Vital Signs ED Triage Vitals  Enc Vitals Group     BP 11/04/18 0836 (!) 130/92     Pulse Rate 11/04/18 0836 71     Resp  11/04/18 0836 18     Temp 11/04/18 0836 97.7 F (36.5 C)     Temp Source 11/04/18 0836 Oral     SpO2 11/04/18 0836 98 %     Weight 11/04/18 0834 195 lb (88.5 kg)     Height 11/04/18 0834 5\' 7"  (1.702 m)     Head Circumference --      Peak Flow --      Pain Score 11/04/18 0834 2     Pain Loc --      Pain Edu? --      Excl. in [place]? --    No data found.  Updated Vital Signs BP (!) 130/92 (BP Location: Left Arm)    Pulse 71    Temp 97.7 F (36.5 C) (Oral)    Resp 18    Ht 5\' 7"  (1.702 m)    Wt 88.5 kg    SpO2 98%    BMI 30.54 kg/m   Visual Acuity Right Eye Distance:   Left Eye Distance:   Bilateral Distance:    Right Eye Near:   Left Eye Near:    Bilateral Near:     Physical Exam  Vitals signs and nursing note reviewed.  Constitutional:      General: He is not in acute distress.    Appearance: He is not toxic-appearing or diaphoretic.  Musculoskeletal:     Right foot: Normal capillary refill. Bony tenderness (over the base of the 5th metatarsal and surrounding area) and swelling (mild) present. No crepitus, deformity or laceration.  Neurological:     Mental Status: He is alert.      UC Treatments / Results  Labs (all labs ordered are listed, but only abnormal results are displayed) Labs Reviewed - No data to display  EKG None  Radiology Dg Foot Complete Right  Result Date: 11/04/2018 CLINICAL DATA:  Tender the base of the fifth metatarsal. EXAM: RIGHT FOOT COMPLETE - 3+ VIEW COMPARISON:  None. FINDINGS: Linear lucency through an os peroneus without separation which may reflect a bipartite os peroneus versus a fractured os peroneus as can be seen with painful os peroneus syndrome. No other acute fracture or dislocation. No aggressive osseous lesion. Soft tissues are normal. IMPRESSION: Linear lucency through an os peroneus without separation which may reflect a bipartite os peroneus versus a fractured os peroneus as can be seen with painful os peroneus syndrome. If there  is further clinical concern, recommend an MRI of the right foot. Electronically Signed   By: Elige Ko   On: 11/04/2018 09:17    Procedures Procedures (including critical care time)  Medications Ordered in UC Medications - No data to display  Initial Impression / Assessment and Plan / UC Course  I have reviewed the triage vital signs and the nursing notes.  Pertinent labs & imaging results that were available during my care of the patient were reviewed by me and considered in my medical decision making (see chart for details).      Final Clinical Impressions(s) / UC Diagnoses   Final diagnoses:  Closed fracture of sesamoid bone of right foot, initial encounter     Discharge Instructions     Rest, ice, elevate, boot for immobilization Follow up with podiatrist    ED Prescriptions    Medication Sig Dispense Auth. Provider   meloxicam (MOBIC) 15 MG tablet Take 1 tablet (15 mg total) by mouth daily. 30 tablet Payton Mccallum, MD   HYDROcodone-acetaminophen (NORCO/VICODIN) 5-325 MG tablet 1-2 tabs po qd prn 10 tablet Payton Mccallum, MD      1. x-ray results and diagnosis reviewed with patient 2. rx as per orders above; reviewed possible side effects, interactions, risks and benefits  3. Immobilized with cast boot; patient states he has crutches 4. Recommend follow up with podiatrist   Controlled Substance Prescriptions [place] Controlled Substance Registry consulted? Not Applicable   Payton Mccallum, MD 11/04/18 [phone]

## 2019-04-12 ENCOUNTER — Encounter: Payer: Self-pay | Admitting: Emergency Medicine

## 2019-04-12 ENCOUNTER — Ambulatory Visit (INDEPENDENT_AMBULATORY_CARE_PROVIDER_SITE_OTHER): Payer: Self-pay

## 2019-04-12 ENCOUNTER — Other Ambulatory Visit: Payer: Self-pay

## 2019-04-12 ENCOUNTER — Ambulatory Visit
Admission: EM | Admit: 2019-04-12 | Discharge: 2019-04-12 | Disposition: A | Discharge status: Home / Self Care | Payer: Self-pay | Attending: Emergency Medicine | Admitting: Emergency Medicine

## 2019-04-12 DIAGNOSIS — M79672 Pain in left foot: Secondary | ICD-10-CM

## 2019-04-12 DIAGNOSIS — M25572 Pain in left ankle and joints of left foot: Secondary | ICD-10-CM

## 2019-04-12 MED ORDER — PREDNISONE 10 MG (21) PO TBPK: ORAL_TABLET | Freq: Every day | ORAL | 0 refills | Status: DC

## 2019-04-12 NOTE — ED Triage Notes (Signed)
Patient c/o left ankle pain and swelling that started 4 weeks ago.  Patient states that he jumped out of his truck four weeks ago and felt some pain in his left ankle then but states that it has gotten worse.

## 2019-04-12 NOTE — ED Provider Notes (Signed)
[facility]    CSN: [id number] Arrival date & time: 04/12/19  1505      History   Chief Complaint Chief Complaint  Patient presents with  . Ankle Pain    left    HPI Nicolas Pearson is a 43 y.o. male.   43 year old male presents with pain in his left ankle and foot. He remembered jumping out of his truck for work about 4 weeks ago and landing on his left foot and feeling immediate pain. He left ankle and foot became swollen the next day- he placed ice on the area and took Tylenol and the swelling improved. He continued to walk on it and the swelling and pain comes and goes. The past 2 days the swelling and pain has gotten much worse- waking him up at night. He is unable to bend his ankle to the side and concerned over a more serious injury. He injured his right foot about 6 months ago but never went to the foot doctor since it healed on it's own. He has a boot at home from that injury that he wore on his left foot for a few days which helped. He is concerned he may have gout or injured his ligaments/tendons in his foot/ankle. Otherwise no other chronic health issues. Takes no daily medication.   The history is provided by the patient.    History reviewed. No pertinent past medical history.  There are no active problems to display for this patient.   Past Surgical History:  Procedure Laterality Date  . BACK SURGERY    . LUMBAR FUSION    . TONSILLECTOMY         Home Medications    Prior to Admission medications   Medication Sig Start Date End Date Taking? Authorizing Provider  predniSONE (STERAPRED UNI-PAK 21 TAB) 10 MG (21) TBPK tablet Take by mouth daily. Take 6 tabs by mouth 1st day then decrease by 1 tablet each day until finished on day 6 04/12/19   Ansh Fauble, Ali Lowe, NP    Family History Family History  Problem Relation Age of Onset  . Hypertension Mother   . Healthy Father     Social History Social History   Tobacco Use  . Smoking status: Never  Smoker  . Smokeless tobacco: Never Used  Substance Use Topics  . Alcohol use: Yes    Comment: occasionally  . Drug use: No     Allergies   Patient has no known allergies.   Review of Systems Review of Systems  Constitutional: Negative for activity change, appetite change, chills, fatigue and fever.  Eyes: Negative for photophobia and visual disturbance.  Respiratory: Negative for cough, chest tightness, shortness of breath and wheezing.   Cardiovascular: Negative for chest pain and leg swelling (only left ankle/foot).  Gastrointestinal: Negative for nausea and vomiting.  Musculoskeletal: Positive for arthralgias, gait problem, joint swelling and myalgias.  Skin: Negative for color change, rash and wound.  Allergic/Immunologic: Negative for immunocompromised state.  Neurological: Negative for dizziness, tremors, seizures, syncope, weakness, light-headedness, numbness and headaches.  Hematological: Negative for adenopathy. Does not bruise/bleed easily.     Physical Exam Triage Vital Signs ED Triage Vitals  Enc Vitals Group     BP 04/12/19 1524 139/90     Pulse Rate 04/12/19 1524 75     Resp 04/12/19 1524 16     Temp 04/12/19 1524 98.3 F (36.8 C)     Temp Source 04/12/19 1524 Oral  SpO2 04/12/19 1524 99 %     Weight 04/12/19 1521 200 lb (90.7 kg)     Height 04/12/19 1521 5\' 7"  (1.702 m)     Head Circumference --      Peak Flow --      Pain Score 04/12/19 1521 8     Pain Loc --      Pain Edu? --      Excl. in [place]? --    No data found.  Updated Vital Signs BP 139/90 (BP Location: Right Arm)   Pulse 75   Temp 98.3 F (36.8 C) (Oral)   Resp 16   Ht 5\' 7"  (1.702 m)   Wt 200 lb (90.7 kg)   SpO2 99%   BMI 31.32 kg/m   Visual Acuity Right Eye Distance:   Left Eye Distance:   Bilateral Distance:    Right Eye Near:   Left Eye Near:    Bilateral Near:     Physical Exam Vitals signs and nursing note reviewed.  Constitutional:      General: He is awake. He  is not in acute distress.    Appearance: Normal appearance. He is well-developed and well-groomed. He is not ill-appearing.     Comments: Patient sitting comfortably in exam chair in no acute distress but limps and has pain when putting weight on his left foot/ankle.   HENT:     Head: Normocephalic and atraumatic.  Eyes:     Extraocular Movements: Extraocular movements intact.     Conjunctiva/sclera: Conjunctivae normal.  Cardiovascular:     Rate and Rhythm: Normal rate.     Pulses:          Dorsalis pedis pulses are 2+ on the right side and 2+ on the left side.       Posterior tibial pulses are 2+ on the right side and 2+ on the left side.  Pulmonary:     Effort: Pulmonary effort is normal.  Musculoskeletal:        General: Swelling and tenderness present.     Right lower leg: No edema.     Left lower leg: No edema.     Right foot: Normal range of motion. No deformity.     Left foot: Decreased range of motion. Normal capillary refill. Tenderness and swelling present. No crepitus, deformity or laceration.       Feet:     Comments: Has decreased range of motion, especially with rotation and full extension. Swelling present at base of 3rd, 4th and 5th metatarsal and extends more towards medial malleolus and slightly to lateral malleolus. No bruising or discoloration seen. Very tender. Good pulses and capillary refill. No neuro deficits noted.   Feet:     Right foot:     Skin integrity: Skin integrity normal.     Left foot:     Skin integrity: Skin integrity normal.  Skin:    General: Skin is warm and dry.     Capillary Refill: Capillary refill takes less than 2 seconds.     Findings: No bruising, ecchymosis, erythema, lesion, petechiae, rash or wound.  Neurological:     General: No focal deficit present.     Mental Status: He is alert and oriented to person, place, and time.     Sensory: Sensation is intact. No sensory deficit.     Motor: Motor function is intact.  Psychiatric:         Mood and Affect: Mood normal.  Behavior: Behavior normal. Behavior is cooperative.        Thought Content: Thought content normal.        Judgment: Judgment normal.      UC Treatments / Results  Labs (all labs ordered are listed, but only abnormal results are displayed) Labs Reviewed - No data to display  EKG   Radiology Dg Ankle Complete Left  Result Date: 04/12/2019 CLINICAL DATA:  Ankle injury after jumping out of a truck. EXAM: LEFT ANKLE COMPLETE - 3+ VIEW COMPARISON:  None. FINDINGS: Normal anatomic alignment. No evidence for acute fracture or dislocation. Talar dome intact. Midfoot degenerative changes. Posterior plantar calcaneal spurring. IMPRESSION: Degenerative changes.  No acute osseous abnormality. Electronically Signed   By: Annia Beltrew  Davis M.D.   On: 04/12/2019 16:23    Procedures Procedures (including critical care time)  Medications Ordered in UC Medications - No data to display  Initial Impression / Assessment and Plan / UC Course  I have reviewed the triage vital signs and the nursing notes.  Pertinent labs & imaging results that were available during my care of the patient were reviewed by me and considered in my medical decision making (see chart for details).    Reviewed x-ray results with patient- some degenerative changes (arthritis) present but concerned that he may have some ligament or tendon damage. Need to see a Podiatrist for further evaluation. 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 Recommend start Prednisone 10mg  6 day dose pack as directed. Ace wrapped applied today and keep foot elevated as much as possible. Wear boot that he has at home for support whenever standing or walking on foot. May take off at night. May take Tylenol 1000mg  every 8 hours as needed for pain. Call Dr. Ether GriffinsFowler on Monday (2 days) to schedule follow-up visit for further evaluation.  Final Clinical Impressions(s) / UC Diagnoses   Final diagnoses:  Acute foot  pain, left  Acute left ankle pain     Discharge Instructions     Recommend start Prednisone 10mg  tablets- take 6 tablets today and then decrease by 1 tablet each day until finished on day 6. Keep ace wrap on ankle and keep elevated as much as possible. Wear foot boot that you have at home whenever you need to walk on your foot. May also take Tylenol 1000mg  every 8 hours as needed for pain. Call Dr. Ether GriffinsFowler, Podiatrist (foot doctor) on Monday to schedule appointment for follow-up of left ankle pain.     ED Prescriptions    Medication Sig Dispense Auth. Provider   predniSONE (STERAPRED UNI-PAK 21 TAB) 10 MG (21) TBPK tablet Take by mouth daily. Take 6 tabs by mouth 1st day then decrease by 1 tablet each day until finished on day 6 21 tablet Sue Fernicola, Ali LoweAnn Berry, NP     Controlled Substance Prescriptions [place] Controlled Substance Registry consulted? Not Applicable   Sudie Grumblingmyot, Sadaf Przybysz Berry, NP 04/13/19 1533

## 2019-04-12 NOTE — Discharge Instructions (Addendum)
Recommend start Prednisone 10mg  tablets- take 6 tablets today and then decrease by 1 tablet each day until finished on day 6. Keep ace wrap on ankle and keep elevated as much as possible. Wear foot boot that you have at home whenever you need to walk on your foot. May also take Tylenol 1000mg  every 8 hours as needed for pain. Call Dr. Vickki Muff, [place] (foot doctor) on Monday to schedule appointment for follow-up of left ankle pain.

## 2019-09-02 ENCOUNTER — Other Ambulatory Visit: Payer: Self-pay

## 2019-09-02 ENCOUNTER — Emergency Department: Payer: Self-pay

## 2019-09-02 ENCOUNTER — Emergency Department
Admission: EM | Admit: 2019-09-02 | Discharge: 2019-09-02 | Disposition: A | Discharge status: Home / Self Care | Payer: Self-pay | Attending: Emergency Medicine | Admitting: Emergency Medicine

## 2019-09-02 ENCOUNTER — Encounter: Payer: Self-pay | Admitting: Emergency Medicine

## 2019-09-02 DIAGNOSIS — R079 Chest pain, unspecified: Secondary | ICD-10-CM | POA: Insufficient documentation

## 2019-09-02 LAB — BASIC METABOLIC PANEL
Anion gap: 11 (ref 5–15)
BUN: 15 mg/dL (ref 6–20)
CO2: 26 mmol/L (ref 22–32)
Calcium: 8.8 mg/dL — ABNORMAL LOW (ref 8.9–10.3)
Chloride: 101 mmol/L (ref 98–111)
Creatinine, Ser: 0.81 mg/dL (ref 0.61–1.24)
GFR calc Af Amer: >60 mL/min (ref >60)
GFR calc non Af Amer: >60 mL/min (ref >60)
Glucose, Bld: 116 mg/dL — ABNORMAL HIGH (ref 70–99)
Potassium: 3.6 mmol/L (ref 3.5–5.1)
Sodium: 138 mmol/L (ref 135–145)

## 2019-09-02 LAB — CBC
HCT: 40.6 % (ref 39.0–52.0)
Hemoglobin: 14.5 g/dL (ref 13.0–17.0)
MCH: 29.7 pg (ref 26.0–34.0)
MCHC: 35.7 g/dL (ref 30.0–36.0)
MCV: 83 fL (ref 80.0–100.0)
Platelets: 252 10*3/uL (ref 150–400)
RBC: 4.89 MIL/uL (ref 4.22–5.81)
RDW: 11.8 % (ref 11.5–15.5)
WBC: 10.3 10*3/uL (ref 4.0–10.5)
nRBC: 0 % (ref 0.0–0.2)

## 2019-09-02 LAB — TROPONIN I (HIGH SENSITIVITY)
Troponin I (High Sensitivity): 9 ng/L (ref <18)
Troponin I (High Sensitivity): 8 ng/L (ref <18)

## 2019-09-02 MED ORDER — IOHEXOL 350 MG/ML SOLN
100.0000 mL | Freq: Once | INTRAVENOUS | Status: AC | PRN
  Administered 2019-09-02: 06:00:00 100 mL via INTRAVENOUS

## 2019-09-02 MED ORDER — DIAZEPAM 5 MG PO TABS: 5.0000 mg | ORAL_TABLET | Freq: Three times a day (TID) | ORAL | 0 refills | Status: DC | PRN

## 2019-09-02 MED ORDER — ASPIRIN 81 MG PO CHEW
324.0000 mg | CHEWABLE_TABLET | Freq: Once | ORAL | Status: AC
  Administered 2019-09-02: 324 mg via ORAL
  Filled 2019-09-02: qty 4

## 2019-09-02 NOTE — ED Triage Notes (Signed)
Patient ambulatory to triage with steady gait, without difficulty or distress noted, mask in place; pt reports awoke PTA with  left sided CP radiating into left arm accomp by dizziness; denies hx of same

## 2019-09-02 NOTE — Discharge Instructions (Addendum)

## 2019-09-02 NOTE — ED Provider Notes (Signed)
[facility] Emergency Department Provider Note  ____________________________________________   First MD Initiated Contact with Patient 09/02/19 [phone]     (approximate)  I have reviewed the triage vital signs and the nursing notes.   HISTORY  Chief Complaint Chest Pain    HPI Nicolas Pearson is a 44 y.o. male with a history of back issues but no other chronic medical issues.  He presents tonight by private vehicle for evaluation of acute onset and severe chest and back pain that started while he was asleep.  Reports that he woke up with pain in the middle and slightly on the left side of his chest that radiates into his upper back  and is radiating down the left arm with some subjective left arm weakness.  No difficulty with ambulation.  He says he can still feel the pain now but it is better than it was earlier when it was sharp and severe.  No shortness of breath.  He had a little bit of dizziness at this time but that has improved.  He has no specific diagnosis of hypertension although he says that every time he goes to a doctor they tell him his blood pressure is high.  He does not take any medications.  He is not a smoker and does not drink alcohol to excess.  He denies any contact with COVID-19 patients.  He denies fever/chills, sore throat, nausea, vomiting, abdominal pain, and dysuria.  Nothing in particular makes his symptoms better or worse.        History reviewed. No pertinent past medical history.  There are no problems to display for this patient.   Past Surgical History:  Procedure Laterality Date  . BACK SURGERY    . LUMBAR FUSION    . TONSILLECTOMY      Prior to Admission medications   Medication Sig Start Date End Date Taking? Authorizing Provider  predniSONE (STERAPRED UNI-PAK 21 TAB) 10 MG (21) TBPK tablet Take by mouth daily. Take 6 tabs by mouth 1st day then decrease by 1 tablet each day until finished on day 6 04/12/19   Katy Apo, NP    Allergies Patient has no known allergies.  Family History  Problem Relation Age of Onset  . Hypertension Mother   . Healthy Father     Social History Social History   Tobacco Use  . Smoking status: Never Smoker  . Smokeless tobacco: Never Used  Substance Use Topics  . Alcohol use: Yes    Comment: occasionally  . Drug use: No    Review of Systems Constitutional: No fever/chills Eyes: No visual changes. ENT: No sore throat. Cardiovascular: Chest pain as described above Respiratory: Denies shortness of breath. Gastrointestinal: No abdominal pain.  No nausea, no vomiting.  No diarrhea.  No constipation. Genitourinary: Negative for dysuria. Musculoskeletal: Chest pain radiating into back and left arm as described above. Integumentary: Negative for rash. Neurological: Some subjective left arm weakness associated with the chest pain initially, now resolved.  No numbness nor tingling.   ____________________________________________   PHYSICAL EXAM:  VITAL SIGNS: ED Triage Vitals  Enc Vitals Group     BP 09/02/19 0442 (!) 165/90     Pulse Rate 09/02/19 0443 75     Resp 09/02/19 0443 16     Temp 09/02/19 0445 98.2 F (36.8 C)     Temp Source 09/02/19 0445 Oral     SpO2 09/02/19 0443 99 %     Weight 09/02/19  0440 90.7 kg (200 lb)     Height 09/02/19 0440 1.702 m (5\' 7" )     Head Circumference --      Peak Flow --      Pain Score 09/02/19 0440 7     Pain Loc --      Pain Edu? --      Excl. in GC? --     Constitutional: Alert and oriented.  Eyes: Conjunctivae are normal.  Head: Atraumatic. Nose: No congestion/rhinnorhea. Mouth/Throat: Patient is wearing a mask. Neck: No stridor.  No meningeal signs.   Cardiovascular: Normal rate, regular rhythm. Good peripheral circulation. Grossly normal heart sounds. Respiratory: Normal respiratory effort.  No retractions. Gastrointestinal: Soft and nontender. No distention.  Musculoskeletal: No lower extremity  tenderness nor edema. No gross deformities of extremities.  The patient has normal strength in bilateral upper and lower extremities.  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 Neurologic:  Normal speech and language. No gross focal neurologic deficits are appreciated.  Skin:  Skin is warm, dry and intact.  Erythema around the MTP of the great toe consistent with podagra. Psychiatric: Mood and affect are normal. Speech and behavior are normal.  ____________________________________________   LABS (all labs ordered are listed, but only abnormal results are displayed)  Labs Reviewed  BASIC METABOLIC PANEL - Abnormal; Notable for the following components:      Result Value   Glucose, Bld 116 (*)    Calcium 8.8 (*)    All other components within normal limits  CBC  TROPONIN I (HIGH SENSITIVITY)  TROPONIN I (HIGH SENSITIVITY)   ____________________________________________  EKG  ED ECG REPORT I, 10/31/19, the attending physician, personally viewed and interpreted this ECG.  Date: 09/02/2019 EKG Time: 4:44 AM Rate: 76 Rhythm: normal sinus rhythm QRS Axis: normal Intervals: normal ST/T Wave abnormalities: normal Narrative Interpretation: no evidence of acute ischemia  ____________________________________________  RADIOLOGY I, 10/31/2019, personally viewed and evaluated these images (plain radiographs) as part of my medical decision making, as well as reviewing the written report by the radiologist.  ED MD interpretation:  No acute abnormalities identified on CXR nor CTA chest/abd/pelvis  Official radiology report(s): DG Chest 2 View  Result Date: 09/02/2019 CLINICAL DATA:  Chest pain EXAM: CHEST - 2 VIEW COMPARISON:  None. FINDINGS: The cardiac silhouette, mediastinal and hilar contours are within normal limits. Mild eventration of the right hemidiaphragm. No infiltrates, edema or effusions. No  pulmonary lesions. The bony thorax is intact. IMPRESSION: No acute cardiopulmonary findings. Electronically Signed   By: 10/31/2019 M.D.   On: 09/02/2019 05:18   CT Angio Chest/Abd/Pel for Dissection W and/or W/WO  Result Date: 09/02/2019 CLINICAL DATA:  Woke up with left-sided chest pain. EXAM: CT ANGIOGRAPHY CHEST, ABDOMEN AND PELVIS TECHNIQUE: Multidetector CT imaging through the chest, abdomen and pelvis was performed using the standard protocol during bolus administration of intravenous contrast. Multiplanar reconstructed images and MIPs were obtained and reviewed to evaluate the vascular anatomy. CONTRAST:  10/31/2019 OMNIPAQUE IOHEXOL 350 MG/ML SOLN COMPARISON:  Chest x-ray, same date. FINDINGS: CTA CHEST FINDINGS Cardiovascular: The heart is normal in size. No pericardial effusion. The aorta is normal in caliber. No dissection. No atherosclerotic calcifications. The branch vessels are patent. No coronary artery calcifications are identified. The pulmonary arteries appear normal. No findings for pulmonary embolism. Mediastinum/Nodes: No mediastinal or hilar mass or adenopathy. The esophagus is unremarkable. Lungs/Pleura: The lungs are  clear. No infiltrates, edema or effusions. No worrisome pulmonary lesions. No interstitial lung disease or bronchiectasis. The tracheobronchial tree is unremarkable. Musculoskeletal: The bony thorax is intact. Review of the MIP images confirms the above findings. CTA ABDOMEN AND PELVIS FINDINGS VASCULAR Aorta: Normal caliber. No atherosclerotic calcifications or dissection. Celiac: Normal SMA: Normal Renals: Normal IMA: Normal Inflow: Moderate tortuosity but no atherosclerotic changes or aneurysm or dissection. Veins: The major venous structures are grossly normal. Review of the MIP images confirms the above findings. NON-VASCULAR Hepatobiliary: Diffuse and marked fatty infiltration of the liver. Focal fatty sparing noted near the gallbladder fossa. No focal hepatic lesions or  intrahepatic biliary dilatation. The portal and hepatic veins are patent. The gallbladder is normal. No common bile duct dilatation. Pancreas: No mass, inflammation or ductal dilatation. Spleen: Normal size. No focal lesions. Adrenals/Urinary Tract: Adrenal glands and kidneys are normal. The bladder is mildly distended but no bladder lesions or calculi. Stomach/Bowel: The stomach, duodenum, small bowel and colon are grossly normal without oral contrast. No inflammatory changes, mass lesions or obstructive findings. The appendix is normal. Lymphatic: No mesenteric or retroperitoneal mass or adenopathy. Small scattered lymph nodes are noted. Reproductive: The prostate gland and seminal vesicles are normal. Other: No pelvic mass or adenopathy. No free pelvic fluid collections. No inguinal mass or adenopathy. No abdominal wall hernia or subcutaneous lesions. Musculoskeletal: No significant bony findings. L5-S1 fusion is noted without complicating features. Review of the MIP images confirms the above findings. IMPRESSION: 1. Normal thoracic and abdominal aorta. No aneurysm or dissection. 2. No CT findings for pulmonary embolism. 3. No acute pulmonary findings. 4. Diffuse and marked fatty infiltration of the liver. 5. No acute abdominal/pelvic findings, mass lesions or lymphadenopathy. 6. Moderate bladder distention. Electronically Signed   By: Rudie Meyer M.D.   On: 09/02/2019 05:50    ____________________________________________   PROCEDURES   Procedure(s) performed (including Critical Care):  Procedures   ____________________________________________   INITIAL IMPRESSION / MDM / ASSESSMENT AND PLAN / ED COURSE  As part of my medical decision making, I reviewed the following data within the electronic MEDICAL RECORD NUMBER Nursing notes reviewed and incorporated, Labs reviewed , EKG interpreted , Old chart reviewed, Patient signed out to Dr. Mayford Knife, Radiograph reviewed  and Notes from prior ED  visits   Differential diagnosis includes, but is not limited to, acute aortic syndrome, ACS, pneumonia, pneumothorax, musculoskeletal pain/strain, biliary colic.  The commendation of acute onset and severe chest and back pain with some subjective neurological deficits and the left upper extremity and a very concern for acute aortic syndrome and I have ordered a CTA chest/abdomen/pelvis to rule out dissection.  I authorized the technologist to proceed without awaiting the results of the metabolic panel given the potential morbidity and mortality of the diagnosis as well as the patient not having history of renal dysfunction.  His EKG is nonischemic and his blood pressure is elevated although this may be chronic.  His pain is improved slightly which is reassuring but he bears further evaluation.  Troponin is pending.  CBC is unremarkable.  I will hold off on any medication intervention until the scan is complete.      Clinical Course as of Sep 01 705  Tue Sep 02, 2019  4827 Troponin I (High Sensitivity): 9 [CF]  [phone] Normal comprehensive metabolic panel  Basic metabolic panel(!) [CF]  0600 No acute abnormalities identified on CTA chest/abd/pelvis.  CT Angio Chest/Abd/Pel for Dissection W and/or W/WO [CF]  0705 Dr.  Williams to follow up on repeat troponin.  If it is essentially unchanged and patient feels well, I anticipated discharge and outpatient follow up.  Gave full dose ASA.  Patient feels well at this time.   [CF]    Clinical Course User Index [CF] Loleta Rose, MD     ____________________________________________  FINAL CLINICAL IMPRESSION(S) / ED DIAGNOSES  Final diagnoses:  Chest pain, unspecified type     MEDICATIONS GIVEN DURING THIS VISIT:  Medications  aspirin chewable tablet 324 mg (has no administration in time range)  iohexol (OMNIPAQUE) 350 MG/ML injection 100 mL (100 mLs Intravenous Contrast Given 09/02/19 0530)     ED Discharge Orders    None      *Please  note:  Nicolas Pearson was evaluated in Emergency Department on 09/02/2019 for the symptoms described in the history of present illness. He was evaluated in the context of the global COVID-19 pandemic, which necessitated consideration that the patient might be at risk for infection with the SARS-CoV-2 virus that causes COVID-19. Institutional protocols and algorithms that pertain to the evaluation of patients at risk for COVID-19 are in a state of rapid change based on information released by regulatory bodies including the CDC and federal and state organizations. These policies and algorithms were followed during the patient's care in the ED.  Some ED evaluations and interventions may be delayed as a result of limited staffing during the pandemic.*  Note:  This document was prepared using Dragon voice recognition software and may include unintentional dictation errors.   Loleta Rose, MD 09/02/19 [phone]

## 2020-08-24 ENCOUNTER — Other Ambulatory Visit: Payer: Self-pay | Admitting: Family Medicine

## 2020-08-24 DIAGNOSIS — R7401 Elevation of levels of liver transaminase levels: Secondary | ICD-10-CM

## 2020-08-31 ENCOUNTER — Ambulatory Visit
Admission: RE | Admit: 2020-08-31 | Discharge: 2020-08-31 | Disposition: A | Discharge status: Home / Self Care | Payer: Self-pay | Source: Ambulatory Visit | Attending: Family Medicine | Admitting: Family Medicine

## 2020-08-31 ENCOUNTER — Other Ambulatory Visit: Payer: Self-pay

## 2020-08-31 DIAGNOSIS — R7401 Elevation of levels of liver transaminase levels: Secondary | ICD-10-CM | POA: Insufficient documentation

## 2020-09-01 ENCOUNTER — Other Ambulatory Visit: Payer: Self-pay | Admitting: Family Medicine

## 2020-09-01 DIAGNOSIS — R932 Abnormal findings on diagnostic imaging of liver and biliary tract: Secondary | ICD-10-CM

## 2020-09-14 ENCOUNTER — Other Ambulatory Visit: Payer: Self-pay

## 2020-09-14 ENCOUNTER — Ambulatory Visit
Admission: RE | Admit: 2020-09-14 | Discharge: 2020-09-14 | Disposition: A | Discharge status: Home / Self Care | Payer: Self-pay | Source: Ambulatory Visit | Attending: Family Medicine | Admitting: Family Medicine

## 2020-09-14 DIAGNOSIS — R932 Abnormal findings on diagnostic imaging of liver and biliary tract: Secondary | ICD-10-CM | POA: Insufficient documentation

## 2020-09-14 MED ORDER — GADOBUTROL 1 MMOL/ML IV SOLN
9.0000 mL | Freq: Once | INTRAVENOUS | Status: AC | PRN
  Administered 2020-09-14: 9 mL via INTRAVENOUS

## 2020-09-23 DIAGNOSIS — K760 Fatty (change of) liver, not elsewhere classified: Secondary | ICD-10-CM | POA: Insufficient documentation

## 2020-09-23 DIAGNOSIS — I10 Essential (primary) hypertension: Secondary | ICD-10-CM | POA: Insufficient documentation

## 2020-09-23 DIAGNOSIS — K862 Cyst of pancreas: Secondary | ICD-10-CM | POA: Insufficient documentation

## 2020-12-23 ENCOUNTER — Ambulatory Visit: Payer: Self-pay | Admitting: Podiatry

## 2020-12-30 ENCOUNTER — Ambulatory Visit: Payer: Self-pay | Admitting: Podiatry

## 2020-12-30 DIAGNOSIS — R030 Elevated blood-pressure reading, without diagnosis of hypertension: Secondary | ICD-10-CM | POA: Insufficient documentation

## 2021-08-31 ENCOUNTER — Other Ambulatory Visit: Payer: Self-pay | Admitting: Family Medicine

## 2021-08-31 DIAGNOSIS — K862 Cyst of pancreas: Secondary | ICD-10-CM

## 2021-09-11 ENCOUNTER — Other Ambulatory Visit: Payer: Self-pay

## 2021-09-11 ENCOUNTER — Emergency Department
Admission: EM | Admit: 2021-09-11 | Discharge: 2021-09-11 | Disposition: A | Discharge status: Home / Self Care | Payer: Self-pay | Attending: Emergency Medicine | Admitting: Emergency Medicine

## 2021-09-11 DIAGNOSIS — M10061 Idiopathic gout, right knee: Secondary | ICD-10-CM | POA: Insufficient documentation

## 2021-09-11 DIAGNOSIS — M109 Gout, unspecified: Secondary | ICD-10-CM

## 2021-09-11 MED ORDER — OXYCODONE HCL 5 MG PO TABS: ORAL_TABLET | ORAL | 0 refills | Status: AC

## 2021-09-11 MED ORDER — PREDNISONE 10 MG (48) PO TBPK: ORAL_TABLET | ORAL | 0 refills | Status: AC

## 2021-09-11 MED ORDER — KETOROLAC TROMETHAMINE 30 MG/ML IJ SOLN
30.0000 mg | Freq: Once | INTRAMUSCULAR | Status: AC
  Administered 2021-09-11: 30 mg via INTRAMUSCULAR
  Filled 2021-09-11: qty 1

## 2021-09-11 MED ORDER — COLCHICINE 0.6 MG PO TABS: 0.6000 mg | ORAL_TABLET | Freq: Two times a day (BID) | ORAL | 0 refills | Status: AC

## 2021-09-11 NOTE — ED Provider Notes (Signed)
[facility] Provider Note    Event Date/Time   First MD Initiated Contact with Patient 09/11/21 [phone]     (approximate)   History   Knee Pain   HPI  Nicolas Pearson is a 46 y.o. male presents to the ED with complaint of right knee pain for approximately 4 to 5 days.  Patient denies any recent injury to his knee and states that he has had been treated in the past at [facility] for gout in his right knee.  This feels very similar.  Symptoms came on suddenly and today he is unable to bear weight without severe pain.  Patient is currently walking with crutches to avoid putting pressure on his knee.  History of hypertension but otherwise healthy.     Physical Exam   Triage Vital Signs: ED Triage Vitals  Enc Vitals Group     BP 09/11/21 0730 (!) 147/94     Pulse Rate 09/11/21 0730 89     Resp 09/11/21 0730 20     Temp 09/11/21 0730 98.2 F (36.8 C)     Temp Source 09/11/21 0730 Oral     SpO2 09/11/21 0730 97 %     Weight 09/11/21 0731 210 lb (95.3 kg)     Height 09/11/21 0731 5\' 7"  (1.702 m)     Head Circumference --      Peak Flow --      Pain Score 09/11/21 0731 10     Pain Loc --      Pain Edu? --      Excl. in GC? --     Most recent vital signs: Vitals:   09/11/21 0730  BP: (!) 147/94  Pulse: 89  Resp: 20  Temp: 98.2 F (36.8 C)  SpO2: 97%     General: Awake, no distress.  CV:  Good peripheral perfusion.  Heart regular rate and rhythm without murmur. Resp:  Normal effort.  Lungs clear bilaterally. Abd:  No distention.  Other:  Right knee without gross deformity.  Anterior patella area is extremely tender to light touch.  No effusion appreciated.  Range of motion is restricted secondary to pain.  No erythema or warmth noted at this time.  No calf tenderness or swelling distally.  Skin is intact.   ED Results / Procedures / Treatments   Labs (all labs ordered are listed, but only abnormal results are displayed) Labs Reviewed - No  data to display    PROCEDURES:  Critical Care performed:   Procedures   MEDICATIONS ORDERED IN ED: Medications  ketorolac (TORADOL) 30 MG/ML injection 30 mg (30 mg Intramuscular Given 09/11/21 0756)     IMPRESSION / MDM / ASSESSMENT AND PLAN / ED COURSE  I reviewed the triage vital signs and the nursing notes.   Differential diagnosis includes, but is not limited to, acute right knee pain, acute gouty arthritis, right knee effusion, right knee strain.  46 year old male presents to the ED with complaint of right knee pain for approximately 4 to 5 days that came on suddenly.  Patient has a history of gout and states that this is very similar to his gouty attacks in the past.  He also has a history of hypertension and currently is on medication.  He states the medication they gave him at [facility] helped alleviate the pain.  Records indicate the patient was given prednisone tapering dose, oxycodone 5 mg #12 and colchicine 0.6 mg twice daily.  Patient was given  a prescription.  He is currently walking with crutches for added support to his knee to prevent falling.  He is to follow-up with his PCP if any continued problems or not improving.    FINAL CLINICAL IMPRESSION(S) / ED DIAGNOSES   Final diagnoses:  Acute gout of right knee, unspecified cause     Rx / DC Orders   ED Discharge Orders          Ordered    colchicine 0.6 MG tablet  2 times daily        09/11/21 0815    predniSONE (STERAPRED UNI-PAK 48 TAB) 10 MG (48) TBPK tablet        09/11/21 0815    oxyCODONE (OXY IR/ROXICODONE) 5 MG immediate release tablet        09/11/21 0815             Note:  This document was prepared using Dragon voice recognition software and may include unintentional dictation errors.   Tommi Rumps, PA-C 09/11/21 1355    Sharman Cheek, MD 09/13/21 0021

## 2021-09-11 NOTE — Discharge Instructions (Signed)
Follow-up with your primary care provider if any continued problems or concerns.  The prescriptions were sent to your pharmacy to begin taking today.  As we discussed before do not drink alcohol with this medication.  Also do not take the oxycodone if you plan on driving or operating machinery as it could cause drowsiness and increase your risk for injury.  Use crutches as needed for support of your knee.

## 2021-09-11 NOTE — ED Triage Notes (Signed)
Pt to ED for right knee pain for a couple weeks. States hx gout in right ankle and knee before. Denies redness, fevers.

## 2022-08-31 IMAGING — MR MR ABDOMEN WO/W CM
17 series · 48 of 48 positions shown · IV contrast (gadavist)
Comparison: No prior abdominal MRI. Abdominal ultrasound
08/31/2020.

CLINICAL DATA: 44-year-old male with history of abnormal findings
in the liver on recent ultrasound examination. Further evaluation
with MRI to provide definitive characterization.

EXAM:
MRI ABDOMEN WITHOUT AND WITH CONTRAST
TECHNIQUE: Multiplanar multisequence MR imaging of the abdomen was performed
both before and after the administration of intravenous contrast.
CONTRAST:  9mL GADAVIST GADOBUTROL 1 MMOL/ML IV SOLN

[Series 2: T2 · coronal · 6.0mm · 1.19mm/px · 1 of 32 slices shown (1 of 2)]
[im 1/32]
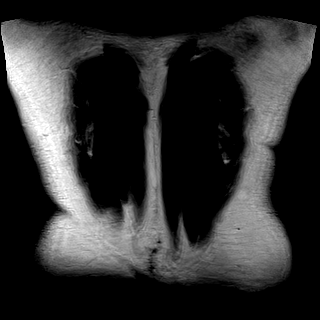

[Series 3: T2 · axial · 6.0mm · 1.25mm/px · 1 of 40 slices shown (2 of 2)]
[im 1/40]
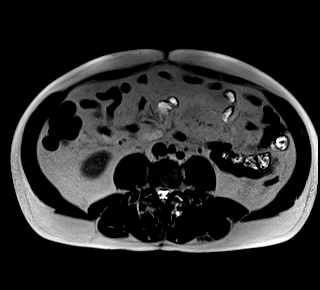

[Series 6: T2 fat-sat · axial · 6.0mm · 1.19mm/px · z∈[-165,+130]mm · 2 of 42 slices shown]
[im 1/42]
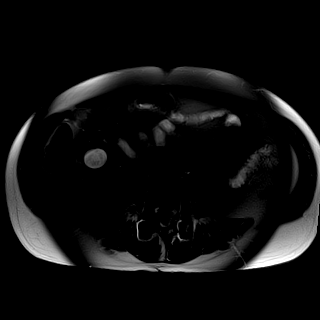
[im 42/42]
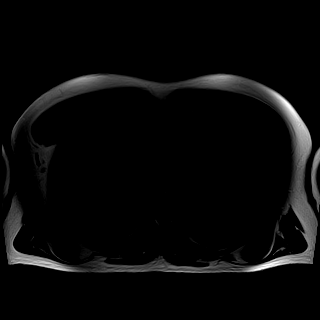

[Series 7: ax dwi_tracew · axial · 6.0mm · 1.42mm/px · z∈[-165,+130]mm · 5 of 126 slices shown]
[im 1/126]
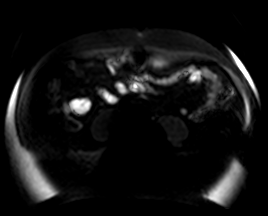
[im 32/126]
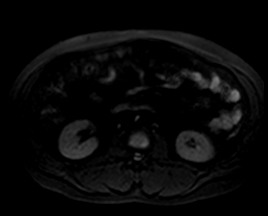
[im 63/126]
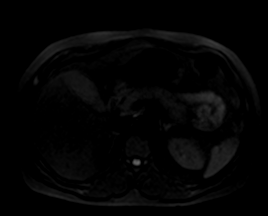
[im 94/126]
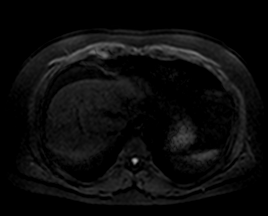
[im 126/126]
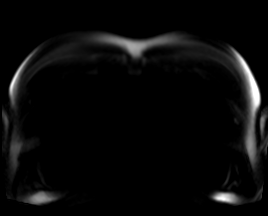

[Series 8: ax dwi_adc · axial · 6.0mm · 1.42mm/px · z∈[-165,+130]mm · 2 of 42 slices shown]
[im 1/42]
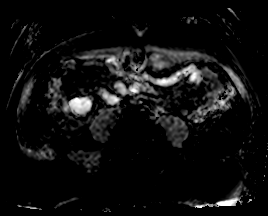
[im 42/42]
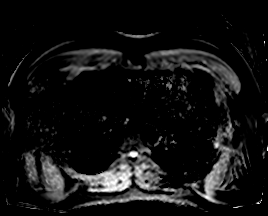

[Series 9: ax in & · axial · 4.0mm · 1.19mm/px · z∈[-160,+124]mm · 5 of 144 slices shown]
[im 1/144]
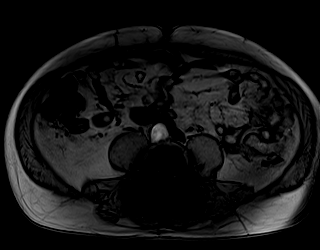
[im 36/144]
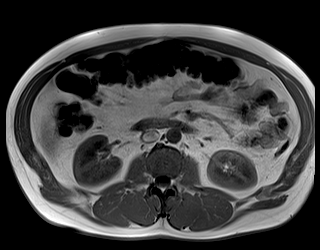
[im 72/144]
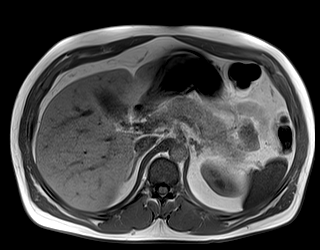
[im 108/144]
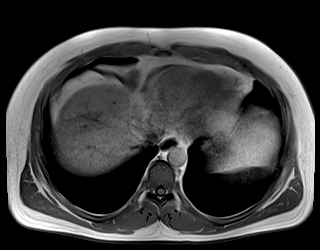
[im 144/144]
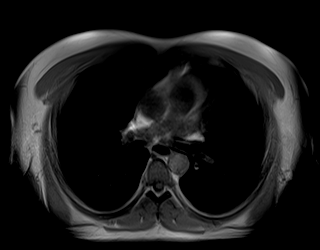

[Series 10: bSSFP · axial · 6.0mm · 0.74mm/px · z∈[-165,+130]mm · 2 of 42 slices shown]
[im 1/42]
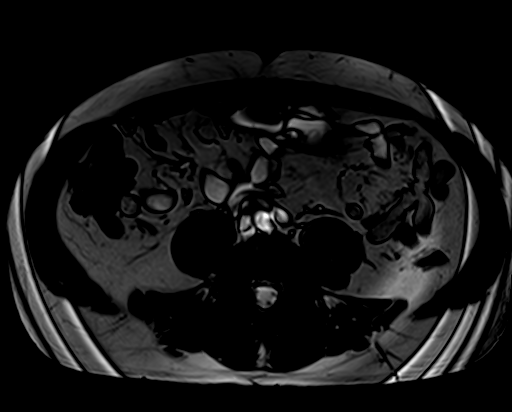
[im 42/42]
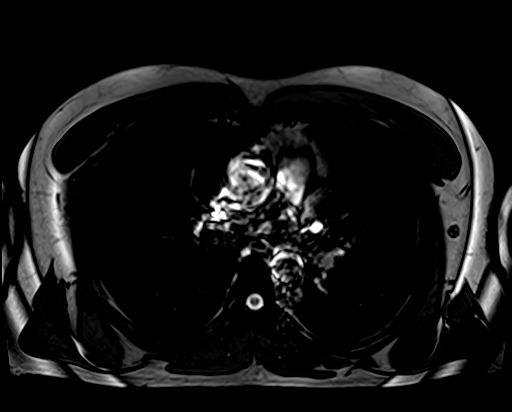

[Series 11: T1 dynamic fat-sat · axial · non-contrast · 4.0mm · 1.19mm/px · z∈[-176,+140]mm · 3 of 80 slices shown (1 of 5)]
[im 1/80]
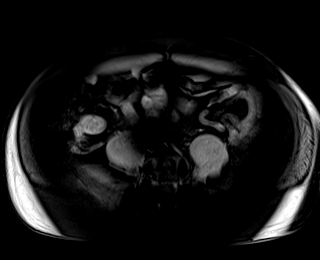
[im 40/80]
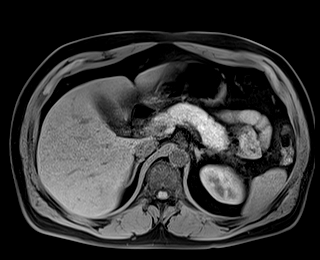
[im 80/80]
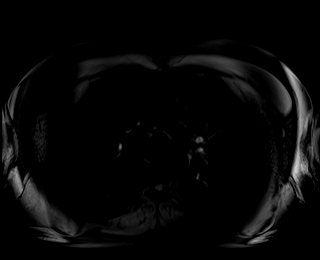

[Series 12: T1 dynamic fat-sat post-contrast · axial · 4.0mm · 1.19mm/px · z∈[-176,+140]mm · 3 of 80 slices shown (1 of 4)]
[im 1/80]
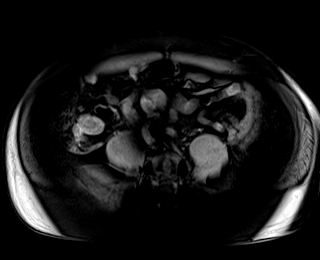
[im 40/80]
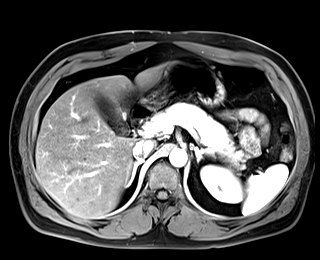
[im 80/80]
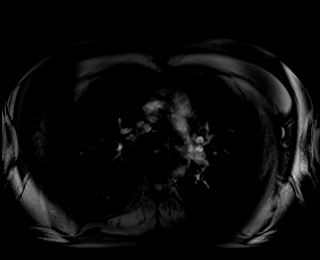

[Series 13: T1 dynamic fat-sat · axial · 4.0mm · 1.19mm/px · z∈[-176,+140]mm · 3 of 80 slices shown (2 of 5)]
[im 1/80]
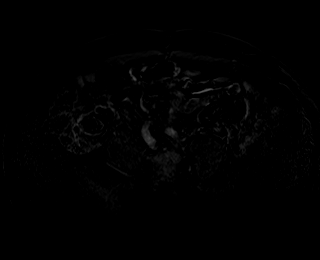
[im 40/80]
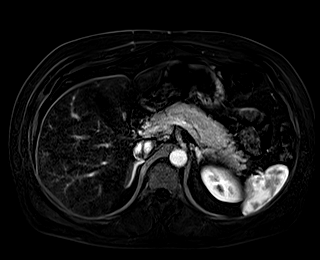
[im 80/80]
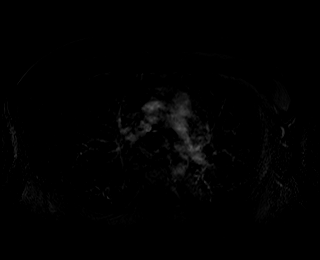

[Series 14: T1 dynamic fat-sat post-contrast · axial · 4.0mm · 1.19mm/px · z∈[-176,+140]mm · 3 of 80 slices shown (2 of 4)]
[im 1/80]
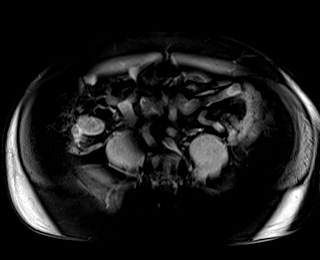
[im 40/80]
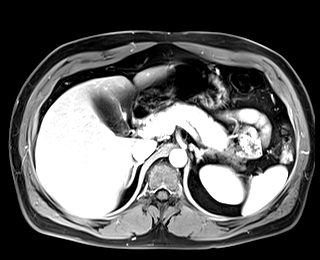
[im 80/80]
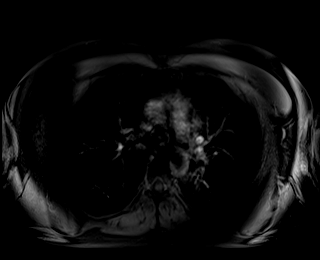

[Series 15: T1 dynamic fat-sat · axial · 4.0mm · 1.19mm/px · z∈[-176,+140]mm · 3 of 80 slices shown (3 of 5)]
[im 1/80]
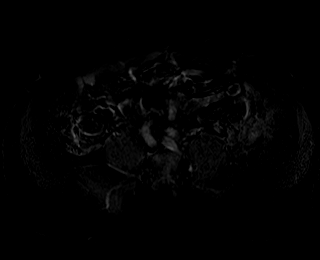
[im 40/80]
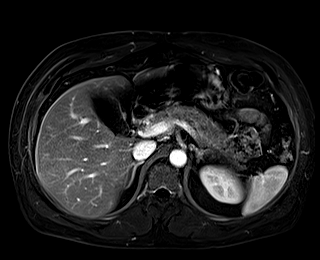
[im 80/80]
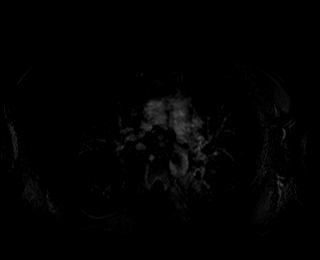

[Series 16: T1 dynamic fat-sat post-contrast · axial · 4.0mm · 1.19mm/px · z∈[-176,+140]mm · 3 of 80 slices shown (3 of 4)]
[im 1/80]
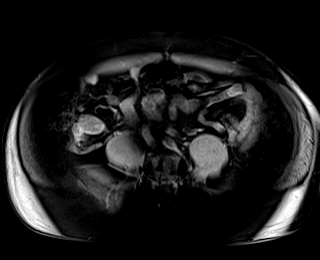
[im 40/80]
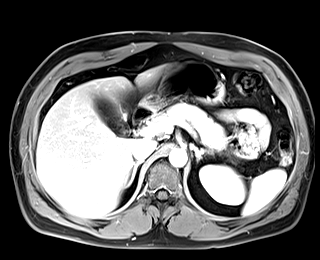
[im 80/80]
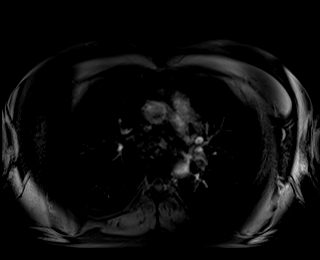

[Series 17: T1 dynamic fat-sat · axial · 4.0mm · 1.19mm/px · z∈[-176,+140]mm · 3 of 80 slices shown (4 of 5)]
[im 1/80]
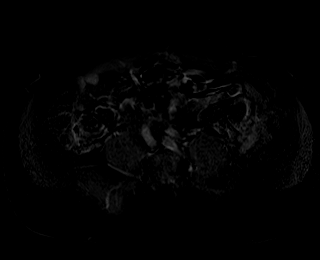
[im 40/80]
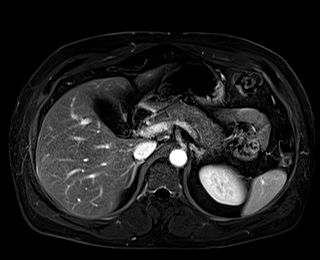
[im 80/80]
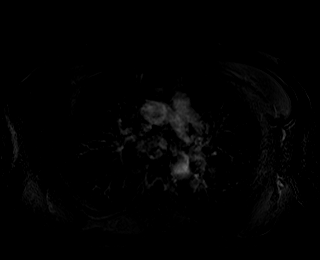

[Series 18: T1 dynamic post-contrast · coronal · 3.0mm · 1.31mm/px · 3 of 72 slices shown]
[im 1/72]
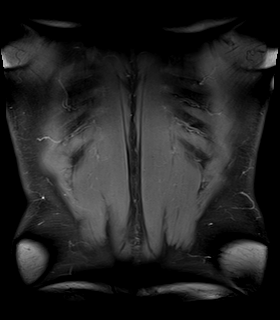
[im 36/72]
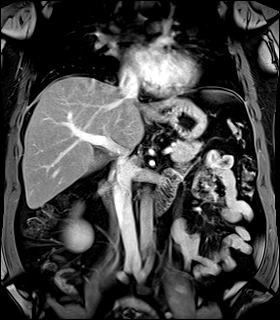
[im 72/72]
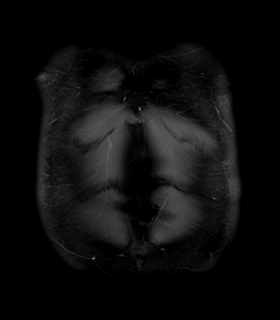

[Series 19: T1 dynamic fat-sat post-contrast · axial · 4.0mm · 1.19mm/px · z∈[-176,+140]mm · 3 of 80 slices shown (4 of 4)]
[im 1/80]
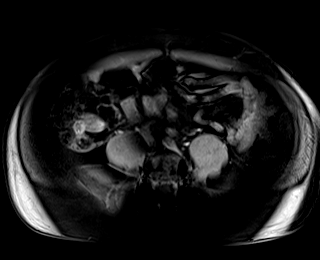
[im 40/80]
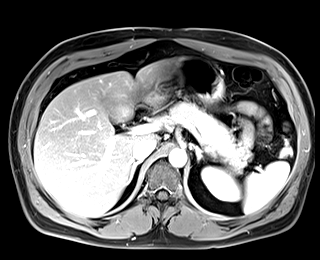
[im 80/80]
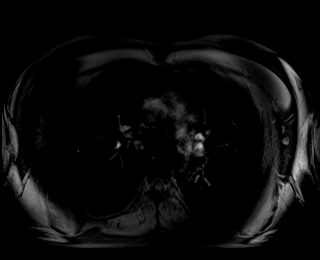

[Series 20: T1 dynamic fat-sat · axial · 4.0mm · 1.19mm/px · z∈[-176,+140]mm · 3 of 80 slices shown (5 of 5)]
[im 1/80]
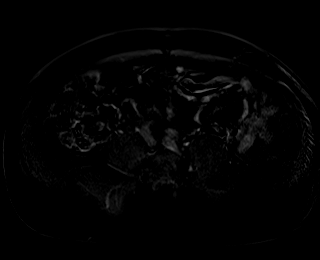
[im 40/80]
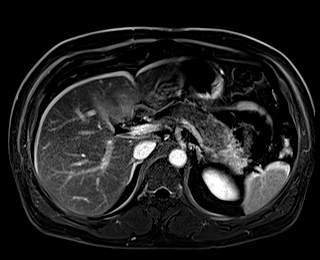
[im 80/80]
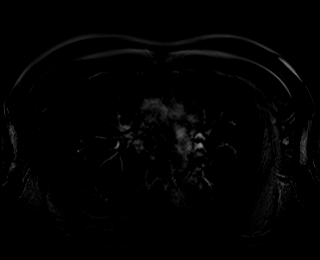

[48 of 48 positions shown; findings below may reference images not displayed]

FINDINGS: Lower chest: Unremarkable.

Hepatobiliary: Severe diffuse loss of signal intensity throughout
the hepatic parenchyma on out of phase dual echo images, indicative
of severe hepatic steatosis. There is some focal fatty sparing
adjacent to the gallbladder fossa predominantly in segments 4A and
4B. No other suspicious cystic or solid hepatic lesions. No intra or
extrahepatic biliary ductal dilatation. Gallbladder is normal in
appearance.

Pancreas: In the pancreatic head (axial image 23 of series 6) there
is a well-defined 1.0 x 0.8 cm T1 hypointense, T2 hyperintense,
nonenhancing lesion which does not appear to be associated with the
main pancreatic duct. No other suspicious pancreatic mass. No
pancreatic ductal dilatation. No peripancreatic fluid collections or
inflammatory changes.

Spleen:  Unremarkable.

Adrenals/Urinary Tract: Subcentimeter T1 hypointense, T2
hyperintense, nonenhancing lesions in the left kidney, compatible
with tiny simple cyst. Right kidney and bilateral adrenal glands are
normal in appearance. No hydroureteronephrosis in the visualized
portions of the abdomen.

Stomach/Bowel: Visualized portions are unremarkable.

Vascular/Lymphatic: No aneurysm identified in the visualized
abdominal vasculature. No lymphadenopathy noted in the visualized
abdomen.

Other: No significant volume of ascites noted in the visualized
portions of the abdomen.

Musculoskeletal: Susceptibility artifact in the lower lumbar spine
incompletely imaged, but presumably from indwelling spinal hardware.
No aggressive appearing osseous lesions are noted in the visualized
portions of the skeleton.
IMPRESSION: 1. Heterogeneous hepatic steatosis with focal fatty sparing adjacent
to the gallbladder fossa corresponding to the perceived abnormality
on the recent ultrasound examination. No aggressive appearing
hepatic lesions are noted.
2. 1.0 x 0.8 cm simple appearing cystic lesion in the head of the
pancreas is nonspecific, but favored to represent a benign lesions
such as a tiny pancreatic pseudocyst or side branch IPMN. Repeat
abdominal MRI with and without IV gadolinium with MRCP is
recommended in 1 year to ensure the stability or resolution of this
finding. This recommendation follows ACR consensus guidelines:
Management of Incidental Pancreatic Cysts: A White Paper of the ACR
Incidental Findings Committee. [HOSPITAL] 6846;[DATE].
3. Additional incidental findings, as above.
